# Patient Record
Sex: Female | Born: 1960 | Race: White | Hispanic: No | Marital: Married | Smoking: Never smoker
Health system: Southern US, Community
[De-identification: ages and names within clinical notes are randomized; demographics above are authoritative.]

## PROBLEM LIST (undated history)

## (undated) DIAGNOSIS — H269 Unspecified cataract: Secondary | ICD-10-CM

## (undated) HISTORY — PX: MENISCUS REPAIR: SHX5179

## (undated) HISTORY — PX: KNEE SURGERY: SHX244

## (undated) HISTORY — DX: Unspecified cataract: H26.9

## (undated) HISTORY — PX: EYE SURGERY: SHX253

---

## 1998-03-31 ENCOUNTER — Other Ambulatory Visit: Admission: RE | Admit: 1998-03-31 | Discharge: 1998-03-31 | Payer: Self-pay | Admitting: Obstetrics and Gynecology

## 2000-03-29 ENCOUNTER — Other Ambulatory Visit: Admission: RE | Admit: 2000-03-29 | Discharge: 2000-03-29 | Payer: Self-pay | Admitting: Obstetrics and Gynecology

## 2002-04-26 ENCOUNTER — Other Ambulatory Visit: Admission: RE | Admit: 2002-04-26 | Discharge: 2002-04-26 | Payer: Self-pay | Admitting: Obstetrics and Gynecology

## 2004-01-21 ENCOUNTER — Other Ambulatory Visit: Admission: RE | Admit: 2004-01-21 | Discharge: 2004-01-21 | Payer: Self-pay | Admitting: Obstetrics and Gynecology

## 2005-07-12 ENCOUNTER — Other Ambulatory Visit: Admission: RE | Admit: 2005-07-12 | Discharge: 2005-07-12 | Payer: Self-pay | Admitting: Obstetrics and Gynecology

## 2010-10-23 ENCOUNTER — Encounter: Admission: RE | Admit: 2010-10-23 | Discharge: 2010-10-23 | Payer: Self-pay | Admitting: Orthopedic Surgery

## 2013-11-07 ENCOUNTER — Ambulatory Visit: Payer: BC Managed Care – PPO | Admitting: Family Medicine

## 2013-11-07 VITALS — BP 118/72 | HR 86 | Temp 98.1°F | Resp 16 | Ht 67.0 in | Wt 129.0 lb

## 2013-11-07 DIAGNOSIS — J069 Acute upper respiratory infection, unspecified: Secondary | ICD-10-CM

## 2013-11-07 DIAGNOSIS — R05 Cough: Secondary | ICD-10-CM

## 2013-11-07 DIAGNOSIS — R059 Cough, unspecified: Secondary | ICD-10-CM

## 2013-11-07 DIAGNOSIS — J029 Acute pharyngitis, unspecified: Secondary | ICD-10-CM

## 2013-11-07 MED ORDER — AZITHROMYCIN 250 MG PO TABS: ORAL_TABLET | ORAL | Status: AC

## 2013-11-07 MED ORDER — HYDROCODONE-HOMATROPINE 5-1.5 MG/5ML PO SYRP: 5.0000 mL | ORAL_SOLUTION | Freq: Three times a day (TID) | ORAL | Status: AC | PRN

## 2013-11-07 NOTE — Progress Notes (Signed)
° °  Subjective:    Patient ID: Katie Ayala, female    DOB: 07/30/1965, 52 y.o.   MRN: [id number]  This chart was scribed for Elvina Sidle, MD by Blanchard Kelch, ED Scribe. The patient was seen in room 10. Patient's care was started at 8:26 PM.   HPI  Katie Ayala is a 52 y.o. female who presents to office complaining of a constant upper respiratory infection that began a few days ago. Associated symptoms include subjective fever, sore throat congestion, and cough. The cough is worse at night. She states that she lost her voice completely yesterday but has been able to speak a little today. She took two Tylenol before coming in without relief of symptoms.   Her son is in the [employer]. He has been in [place] for six months. 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  Past Medical History  Diagnosis Date   Cataract    Past Surgical History  Procedure Laterality Date   Cesarean section     Eye surgery     Knee surgery     History reviewed. No pertinent family history. No current outpatient prescriptions on file prior to visit.   No current facility-administered medications on file prior to visit.   No Known Allergies   Review of Systems  Constitutional: Positive for fever.  HENT: Positive for congestion and sore throat. Negative for drooling.   Eyes: Negative for discharge.  Respiratory: Positive for cough.   Cardiovascular: Negative for leg swelling.  Gastrointestinal: Negative for vomiting.  Endocrine: Negative for polyuria.  Genitourinary: Negative for hematuria.  Musculoskeletal: Negative for gait problem.  Skin: Negative for rash.  Allergic/Immunologic: Negative for immunocompromised state.  Neurological: Negative for speech difficulty.  Hematological: Positive for adenopathy.  Psychiatric/Behavioral: Negative for confusion.       Objective:   Physical Exam  Nursing note and vitals  reviewed. CONSTITUTIONAL: Well developed/well nourished HEAD: Normocephalic/atraumatic EYES: EOMI/PERRL ENMT: Mucous membranes moist, oropharynx erythematous, right ear effusion NECK: supple no meningeal signs, anterior cervical adenopathy SPINE:entire spine nontender CV: S1/S2 noted, no murmurs/rubs/gallops noted LUNGS: Lungs are clear to auscultation bilaterally, no apparent distress ABDOMEN: soft, nontender, no rebound or guarding GU:no cva tenderness NEURO: Pt is awake/alert, moves all extremitiesx4 EXTREMITIES: pulses normal, full ROM SKIN: warm, color normal PSYCH: no abnormalities of mood noted       Assessment & Plan:   I personally performed the services described in this documentation, which was scribed in my presence. The recorded information has been reviewed and is accurate.  Cough - Plan: azithromycin (ZITHROMAX Z-PAK) 250 MG tablet, HYDROcodone-homatropine (HYCODAN) 5-1.5 MG/5ML syrup  Acute pharyngitis - Plan: azithromycin (ZITHROMAX Z-PAK) 250 MG tablet, HYDROcodone-homatropine (HYCODAN) 5-1.5 MG/5ML syrup  Acute upper respiratory infections of unspecified site - Plan: azithromycin (ZITHROMAX Z-PAK) 250 MG tablet, HYDROcodone-homatropine (HYCODAN) 5-1.5 MG/5ML syrup  Signed, Elvina Sidle, MD

## 2014-07-23 ENCOUNTER — Ambulatory Visit (INDEPENDENT_AMBULATORY_CARE_PROVIDER_SITE_OTHER): Payer: BC Managed Care – PPO | Admitting: Internal Medicine

## 2014-07-23 VITALS — BP 114/70 | HR 65 | Temp 98.0°F | Resp 16 | Ht 66.5 in | Wt 128.6 lb

## 2014-07-23 DIAGNOSIS — H6122 Impacted cerumen, left ear: Secondary | ICD-10-CM

## 2014-07-23 DIAGNOSIS — H6120 Impacted cerumen, unspecified ear: Secondary | ICD-10-CM

## 2014-07-23 NOTE — Progress Notes (Signed)
   Subjective:  This chart was scribed for Tonye Pearsonobert P Charniece Venturino, MD by Charline BillsEssence Howell, ED Scribe. The patient was seen in room 9. Patient's care was started at 10:58 AM.   Patient ID: Katie BeckersElizabeth Ayala, female    DOB: 1961-10-19, 53 y.o.   MRN: [id number]  Chief Complaint  Patient presents with  . Otalgia    left x 4 days   HPI HPI Comments: Katie Beckerslizabeth Agro is a 53 y.o. female who presents to the [facility] complaining of L ear pain onset 4 days ago. Pt reports associated dullness in hearing. She states "it feels like I have 10 cotton balls in my ear." She reports one similar episode. Pt has taken Zyrtec with no relief. Pt has also tried applying warm water and peroxide in her ear with no relief.   Past Medical History  Diagnosis Date  . Cataract    No current outpatient prescriptions on file prior to visit.   No current facility-administered medications on file prior to visit.   Not on File  Review of Systems  Constitutional: Negative for fever, chills and fatigue.  HENT: Positive for ear pain.       Objective:   Physical Exam  Nursing note and vitals reviewed. Constitutional: She is oriented to person, place, and time. She appears well-developed and well-nourished.  HENT:  Head: Normocephalic and atraumatic.  Right Ear: Tympanic membrane normal.  L canal: Totally occluded by wax  Eyes: Conjunctivae and EOM are normal.  Neck: Neck supple.  Cardiovascular: Normal rate.   Pulmonary/Chest: Effort normal.  Musculoskeletal: Normal range of motion.  Neurological: She is alert and oriented to person, place, and time.  Skin: Skin is warm and dry.  Psychiatric: She has a normal mood and affect. Her behavior is normal.  Triage Vitals: BP 114/70  Pulse 65  Temp(Src) 98 F (36.7 C) (Oral)  Resp 16  Ht 5' 6.5" (1.689 m)  Wt 128 lb 9.6 oz (58.333 kg)  BMI 20.45 kg/m2  SpO2 100%     Irrigation successful-canal clear without inflammation/TM  intact  Assessment & Plan:   I personally performed the services described in this documentation, which was scribed in my presence. The recorded information has been reviewed and is accurate.  Cerumen impaction resolved by irrigation  Discussed preventive measures Followup when necessary

## 2014-08-16 ENCOUNTER — Telehealth: Payer: Self-pay

## 2014-08-16 NOTE — Telephone Encounter (Signed)
Pt has a high deductible. Advised her that we have the vaccine here that we are happy to administer, but she can get it at [facility] for half of the price. Can we prescribe this for her?

## 2014-08-16 NOTE — Telephone Encounter (Signed)
Patient needs RX for whooping cough vaccination before she can be around her grandchild.

## 2014-08-17 MED ORDER — TETANUS-DIPHTH-ACELL PERTUSSIS 5-2.5-18.5 LF-MCG/0.5 IM SUSP: 0.5000 mL | Freq: Once | INTRAMUSCULAR | Status: AC

## 2014-08-17 NOTE — Telephone Encounter (Signed)
Sent to the pharmacy.  Please have her bring [place] the documentation so we can put it in her chart.

## 2014-08-18 NOTE — Telephone Encounter (Signed)
lmom that rx was sent into pharmacy and for her to bring in documentation

## 2016-03-22 ENCOUNTER — Telehealth: Payer: Self-pay | Admitting: Hematology

## 2016-03-22 NOTE — Telephone Encounter (Signed)
Pt called in inquring about the cost and how to obtain a new patient appt and asked question about her insurance.

## 2016-06-02 ENCOUNTER — Other Ambulatory Visit: Payer: Self-pay | Admitting: Obstetrics and Gynecology

## 2016-06-02 DIAGNOSIS — R928 Other abnormal and inconclusive findings on diagnostic imaging of breast: Secondary | ICD-10-CM

## 2016-06-09 ENCOUNTER — Ambulatory Visit
Admission: RE | Admit: 2016-06-09 | Discharge: 2016-06-09 | Disposition: A | Discharge status: Home / Self Care | Payer: BLUE CROSS/BLUE SHIELD | Source: Ambulatory Visit | Attending: Obstetrics and Gynecology | Admitting: Obstetrics and Gynecology

## 2016-06-09 DIAGNOSIS — R928 Other abnormal and inconclusive findings on diagnostic imaging of breast: Secondary | ICD-10-CM

## 2020-08-12 ENCOUNTER — Other Ambulatory Visit: Payer: Self-pay | Admitting: Obstetrics and Gynecology

## 2020-08-12 DIAGNOSIS — R928 Other abnormal and inconclusive findings on diagnostic imaging of breast: Secondary | ICD-10-CM

## 2020-08-26 ENCOUNTER — Other Ambulatory Visit: Payer: Self-pay | Admitting: Obstetrics and Gynecology

## 2020-08-26 ENCOUNTER — Other Ambulatory Visit: Payer: Self-pay

## 2020-08-26 ENCOUNTER — Ambulatory Visit
Admission: RE | Admit: 2020-08-26 | Discharge: 2020-08-26 | Disposition: A | Discharge status: Home / Self Care | Payer: No Typology Code available for payment source | Source: Ambulatory Visit | Attending: Obstetrics and Gynecology | Admitting: Obstetrics and Gynecology

## 2020-08-26 DIAGNOSIS — R928 Other abnormal and inconclusive findings on diagnostic imaging of breast: Secondary | ICD-10-CM

## 2020-08-26 DIAGNOSIS — R921 Mammographic calcification found on diagnostic imaging of breast: Secondary | ICD-10-CM

## 2020-09-16 ENCOUNTER — Other Ambulatory Visit: Payer: Self-pay

## 2020-09-16 ENCOUNTER — Ambulatory Visit
Admission: RE | Admit: 2020-09-16 | Discharge: 2020-09-16 | Disposition: A | Discharge status: Home / Self Care | Payer: No Typology Code available for payment source | Source: Ambulatory Visit | Attending: Obstetrics and Gynecology | Admitting: Obstetrics and Gynecology

## 2020-09-16 DIAGNOSIS — R921 Mammographic calcification found on diagnostic imaging of breast: Secondary | ICD-10-CM

## 2020-09-26 ENCOUNTER — Other Ambulatory Visit: Payer: Self-pay

## 2020-09-26 ENCOUNTER — Ambulatory Visit
Admission: RE | Admit: 2020-09-26 | Discharge: 2020-09-26 | Disposition: A | Discharge status: Home / Self Care | Payer: No Typology Code available for payment source | Source: Ambulatory Visit | Attending: Obstetrics and Gynecology | Admitting: Obstetrics and Gynecology

## 2020-09-26 ENCOUNTER — Ambulatory Visit
Admission: RE | Admit: 2020-09-26 | Discharge: 2020-09-26 | Disposition: A | Discharge status: Home / Self Care | Payer: PRIVATE HEALTH INSURANCE | Source: Ambulatory Visit | Attending: Obstetrics and Gynecology | Admitting: Obstetrics and Gynecology

## 2020-09-26 HISTORY — PX: BREAST BIOPSY: SHX20

## 2020-10-01 ENCOUNTER — Other Ambulatory Visit: Payer: Self-pay | Admitting: Obstetrics and Gynecology

## 2020-10-01 DIAGNOSIS — R921 Mammographic calcification found on diagnostic imaging of breast: Secondary | ICD-10-CM

## 2020-11-28 ENCOUNTER — Ambulatory Visit: Payer: Self-pay

## 2021-02-27 ENCOUNTER — Other Ambulatory Visit: Payer: Self-pay | Admitting: Obstetrics and Gynecology

## 2021-02-27 DIAGNOSIS — R921 Mammographic calcification found on diagnostic imaging of breast: Secondary | ICD-10-CM

## 2021-04-15 ENCOUNTER — Other Ambulatory Visit: Payer: Self-pay | Admitting: Obstetrics and Gynecology

## 2021-04-15 ENCOUNTER — Ambulatory Visit
Admission: RE | Admit: 2021-04-15 | Discharge: 2021-04-15 | Disposition: A | Discharge status: Home / Self Care | Payer: Self-pay | Source: Ambulatory Visit | Attending: Obstetrics and Gynecology | Admitting: Obstetrics and Gynecology

## 2021-04-15 ENCOUNTER — Ambulatory Visit
Admission: RE | Admit: 2021-04-15 | Discharge: 2021-04-15 | Disposition: A | Discharge status: Home / Self Care | Payer: PRIVATE HEALTH INSURANCE | Source: Ambulatory Visit | Attending: Obstetrics and Gynecology | Admitting: Obstetrics and Gynecology

## 2021-04-15 DIAGNOSIS — N6320 Unspecified lump in the left breast, unspecified quadrant: Secondary | ICD-10-CM

## 2021-04-15 DIAGNOSIS — R921 Mammographic calcification found on diagnostic imaging of breast: Secondary | ICD-10-CM

## 2021-04-29 ENCOUNTER — Ambulatory Visit
Admission: RE | Admit: 2021-04-29 | Discharge: 2021-04-29 | Disposition: A | Discharge status: Home / Self Care | Payer: PRIVATE HEALTH INSURANCE | Source: Ambulatory Visit | Attending: Obstetrics and Gynecology | Admitting: Obstetrics and Gynecology

## 2021-04-29 ENCOUNTER — Other Ambulatory Visit: Payer: Self-pay

## 2021-04-29 DIAGNOSIS — R921 Mammographic calcification found on diagnostic imaging of breast: Secondary | ICD-10-CM

## 2021-08-04 IMAGING — MG MM BREAST LOCALIZATION CLIP
4 series · 4 of 12 positions shown · non-contrast
Comparison: Previous exams including recent diagnostic mammogram
dated 04/15/2021.

CLINICAL DATA: Status post stereotactic biopsy for indeterminate
calcifications within the upper-outer quadrant of the LEFT breast.

Recent biopsy-proven complex sclerosing lesion within the
upper-outer quadrant of the LEFT breast (coil shaped clip).
EXAM:
DIAGNOSTIC LEFT MAMMOGRAM POST STEREOTACTIC BIOPSY

[L CC synth-2D]
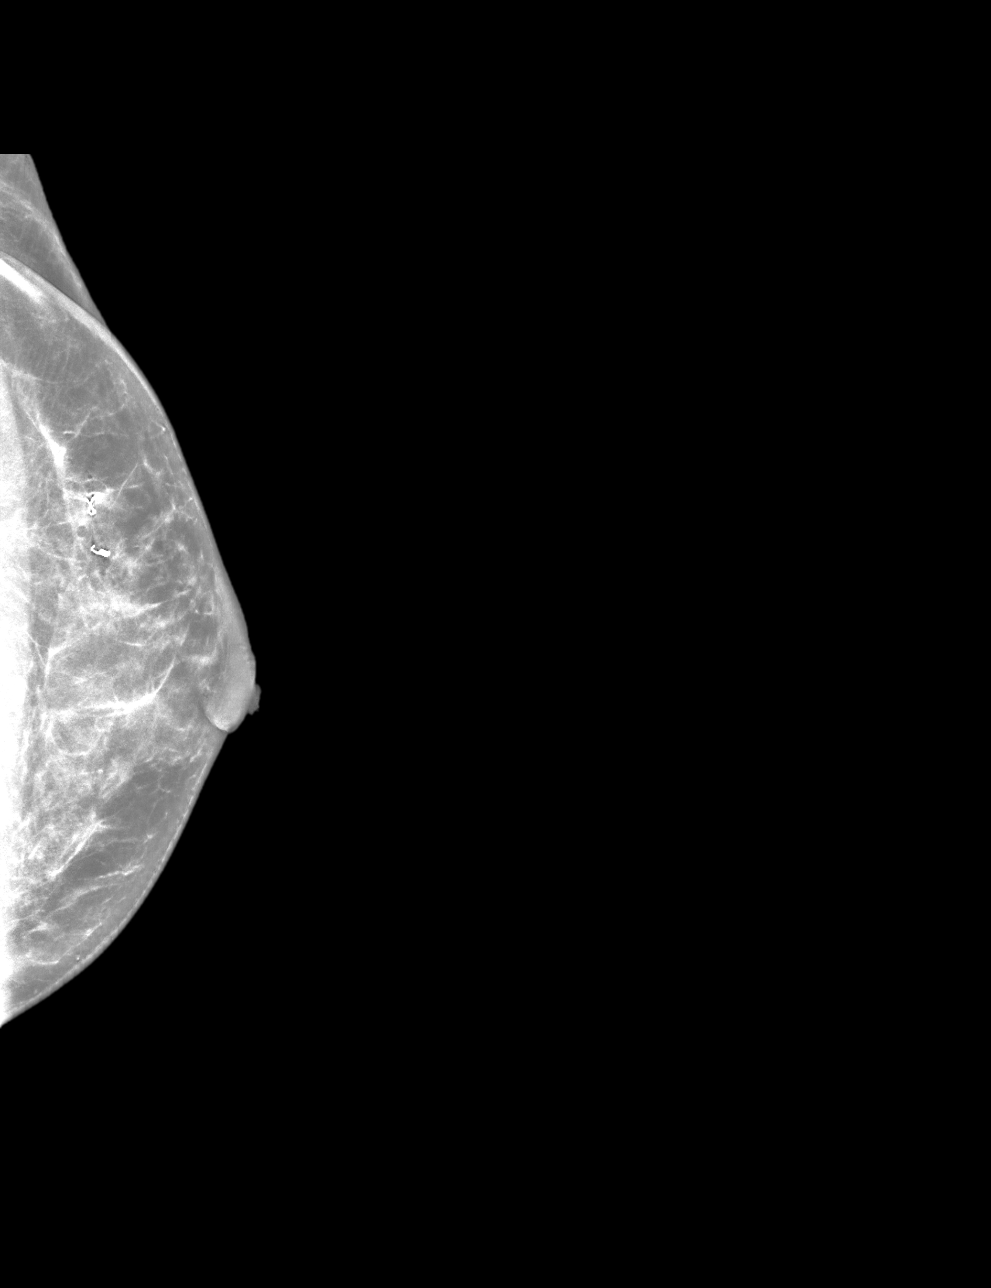

[L LM synth-2D]
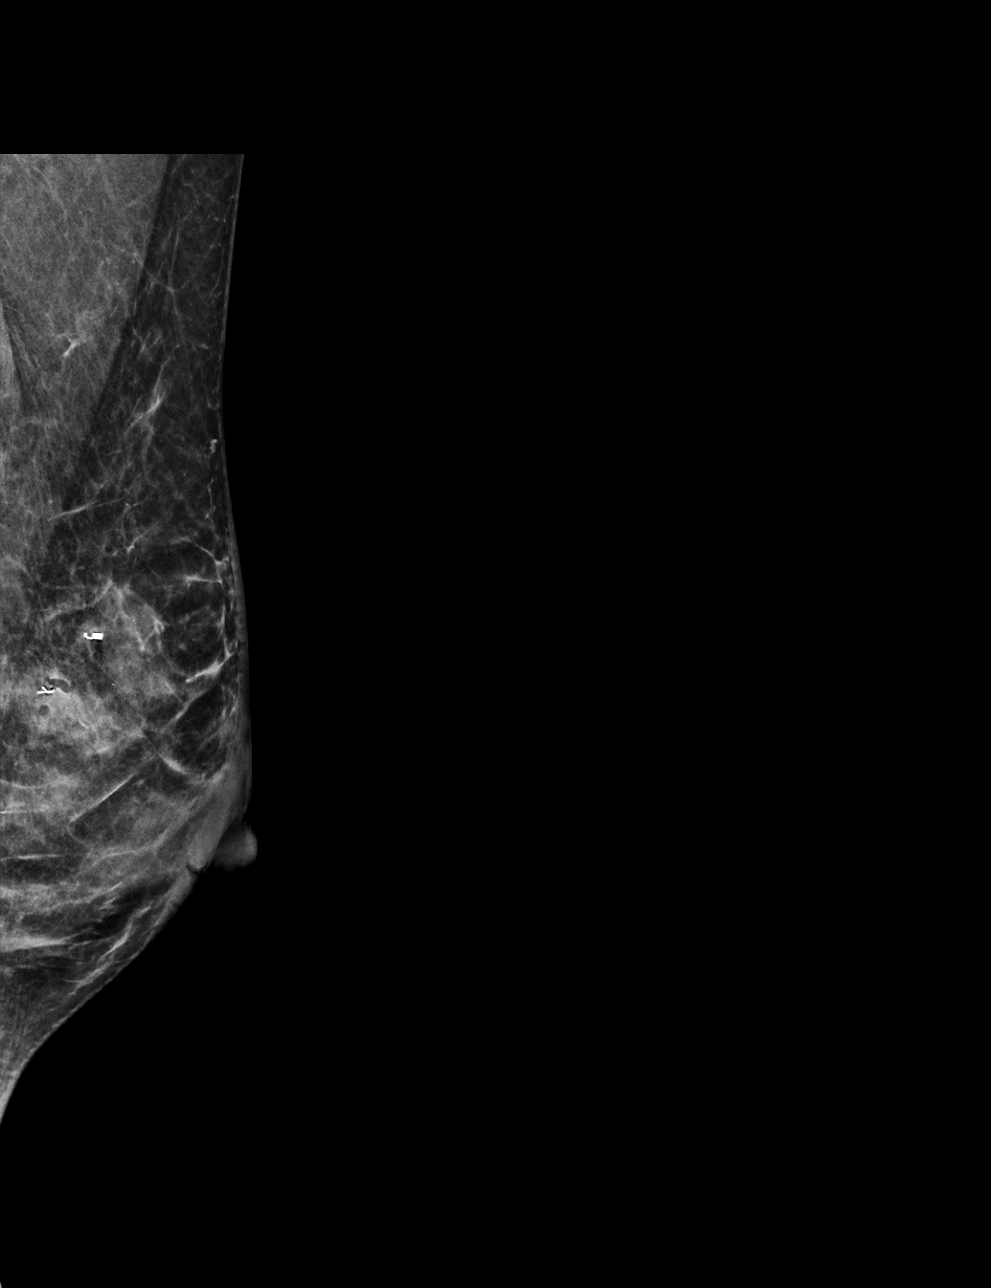

[L LM tomo · tomo slice 22/43.0]
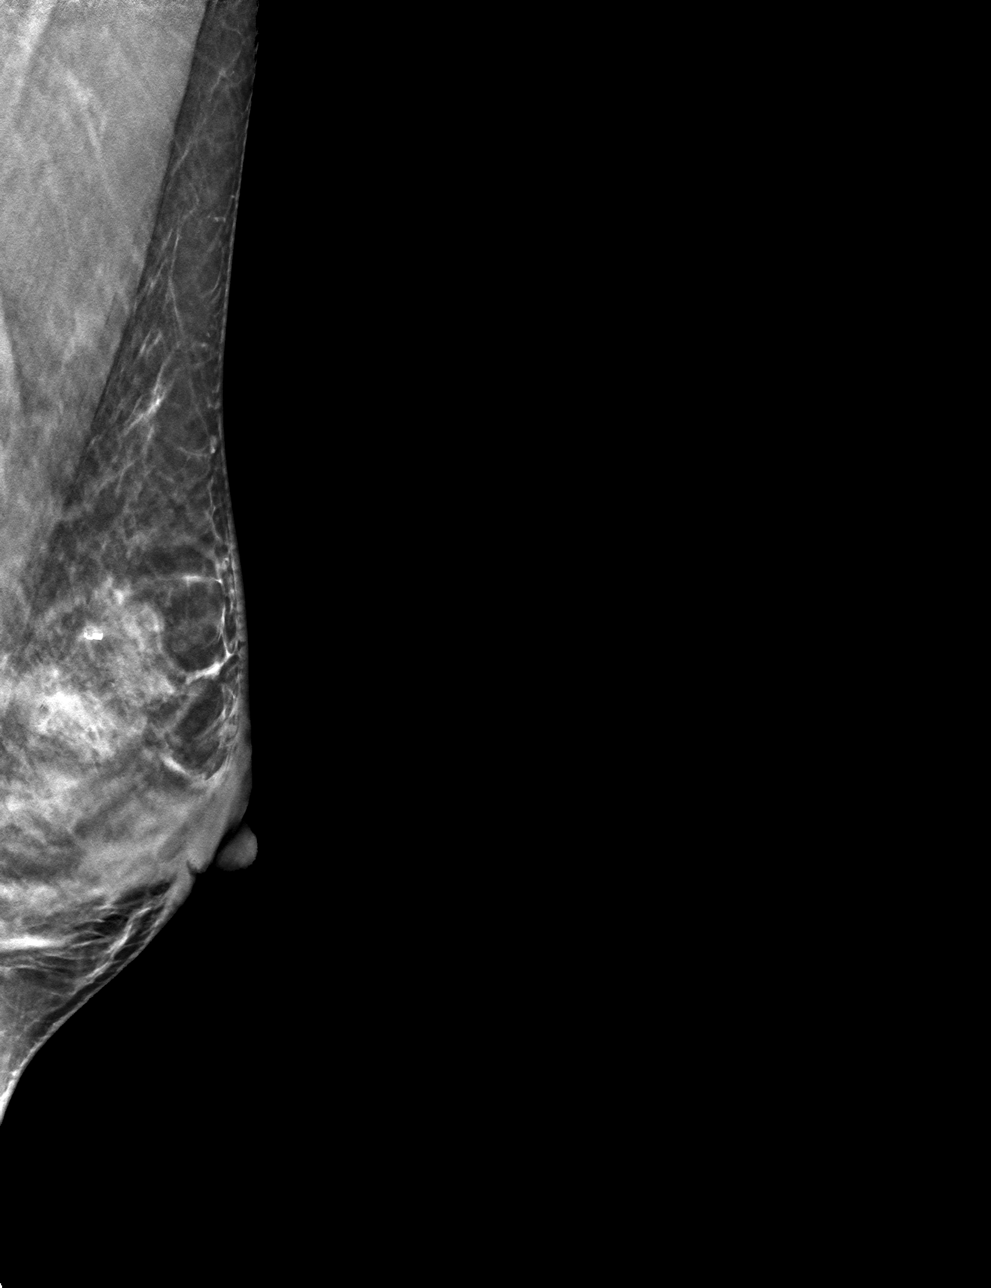

[L CC tomo · tomo slice 25/49.0]
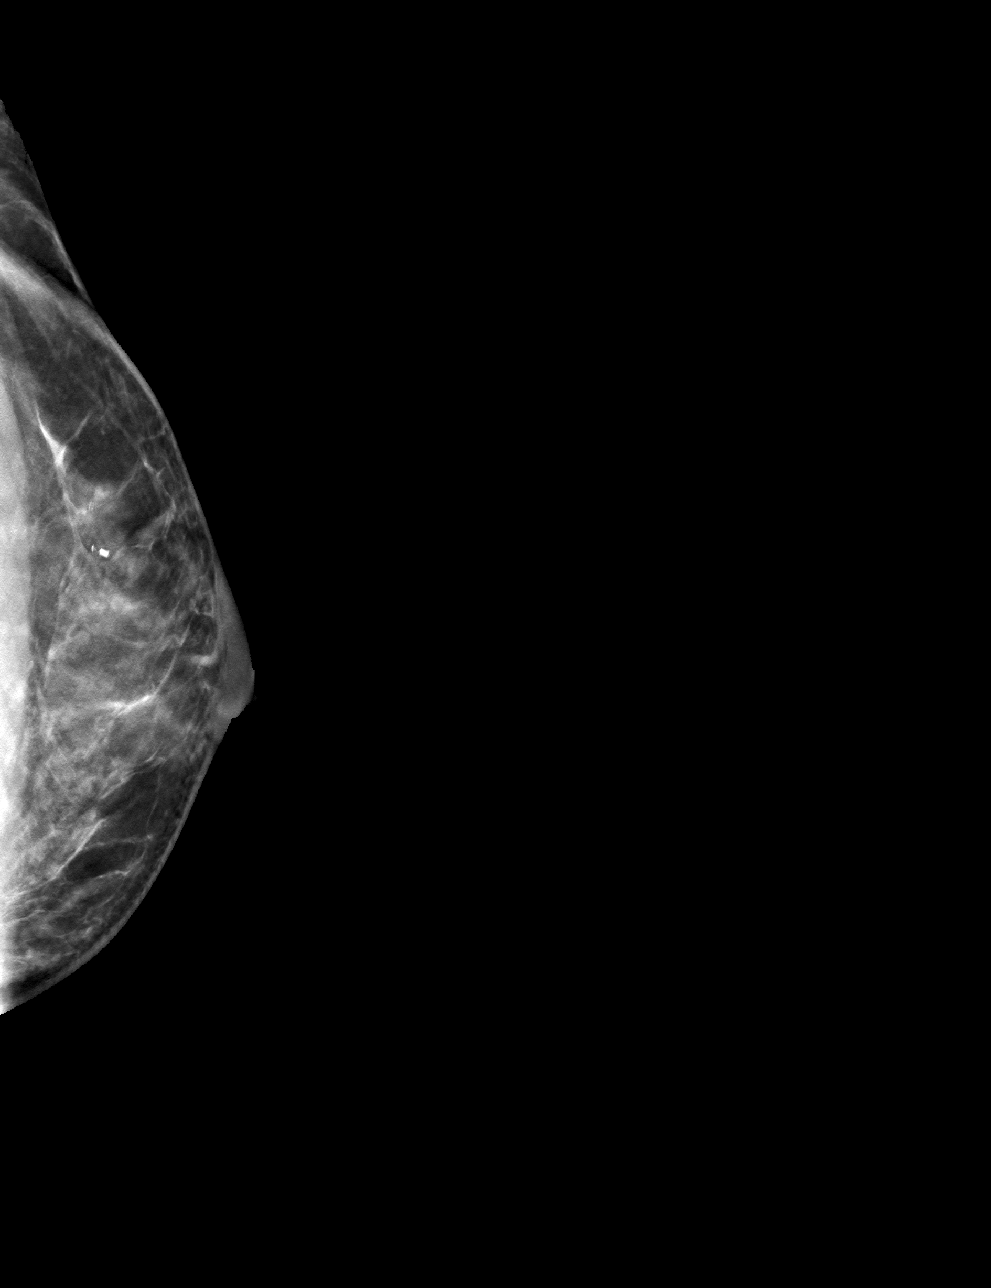

[4 of 12 positions shown; findings below may reference images not displayed]

FINDINGS: Mammographic images were obtained following stereotactic guided
biopsy of the indeterminate calcifications within the upper-outer
quadrant of the LEFT breast. The biopsy marking clip is in expected
position at the site of biopsy.
IMPRESSION: 1. Appropriate positioning of the ribbon shaped biopsy marking clip
at the site of biopsy in the upper-outer quadrant of the LEFT
breast.
2. Coil shaped clip within the upper-outer quadrant of the LEFT
breast corresponds to a biopsy-proven complex sclerosing lesion for
which surgical excision is recommended.
3. Additional similar-appearing amorphous calcifications described
on recent diagnostic mammogram report of 04/15/2021 are located
approximately 1 cm anterior-medial to the ribbon clip and less than
1 cm inferior-medial to the coil shaped clip, therefore, no
additional stereotactic biopsy is required today. If surgical
lumpectomy/excision is eventually required for the calcifications
biopsied today based on pathology result, the additional
similar-appearing calcifications described above can be localized
for excision as well.

Final Assessment: Post Procedure Mammograms for Marker Placement

## 2021-08-04 IMAGING — MG MM BREAST BX W LOC DEV 1ST LESION IMAGE BX SPEC STEREO GUIDE*L*
7 of 16 series · 7 of 36 positions shown · non-contrast
Comparison: Previous exams.
COMPARISON: Previous exams.

Addendum:
CLINICAL DATA: Biopsy-proven complex sclerosing lesion within the
upper-outer quadrant of the LEFT breast, with associated coil shaped
biopsy clip.

[L (1 of 7)]
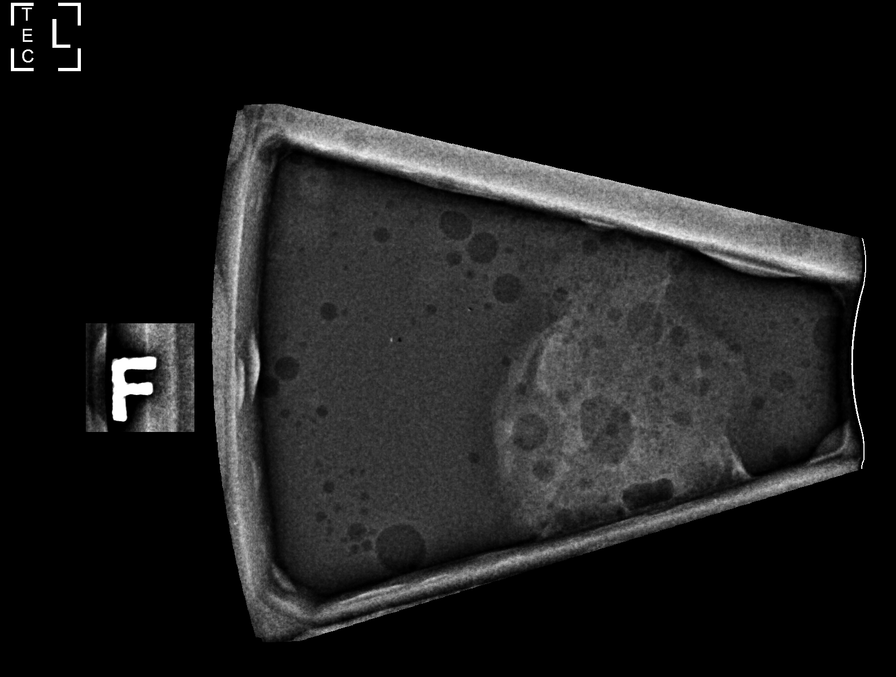

[L (2 of 7)]
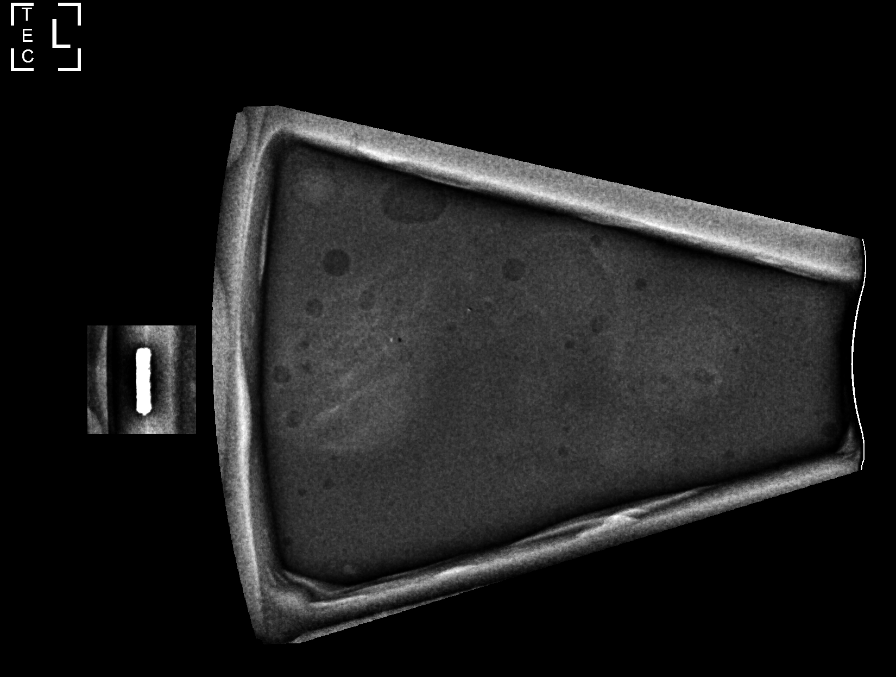

[L (3 of 7)]
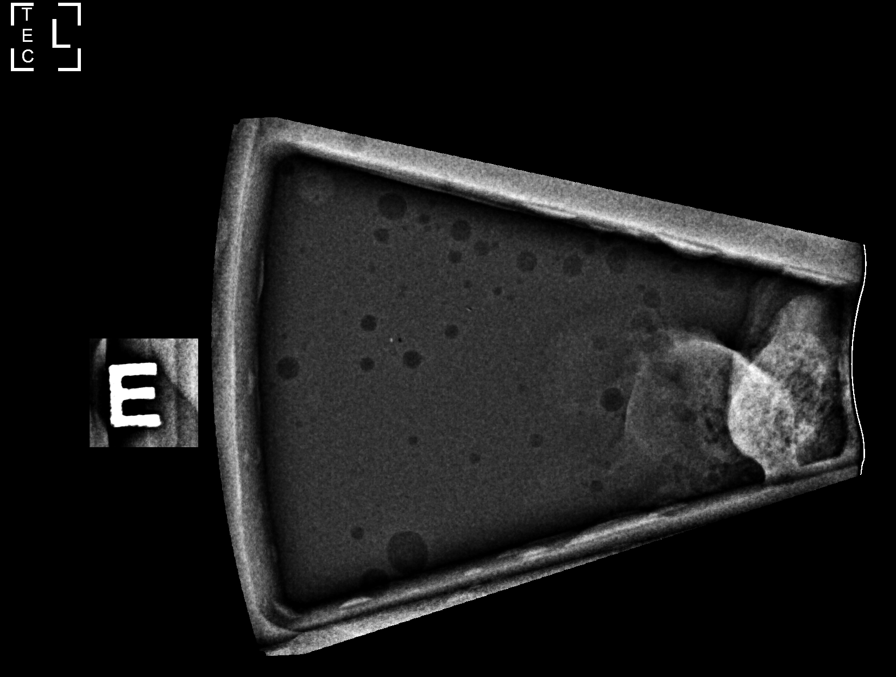

[L (4 of 7)]
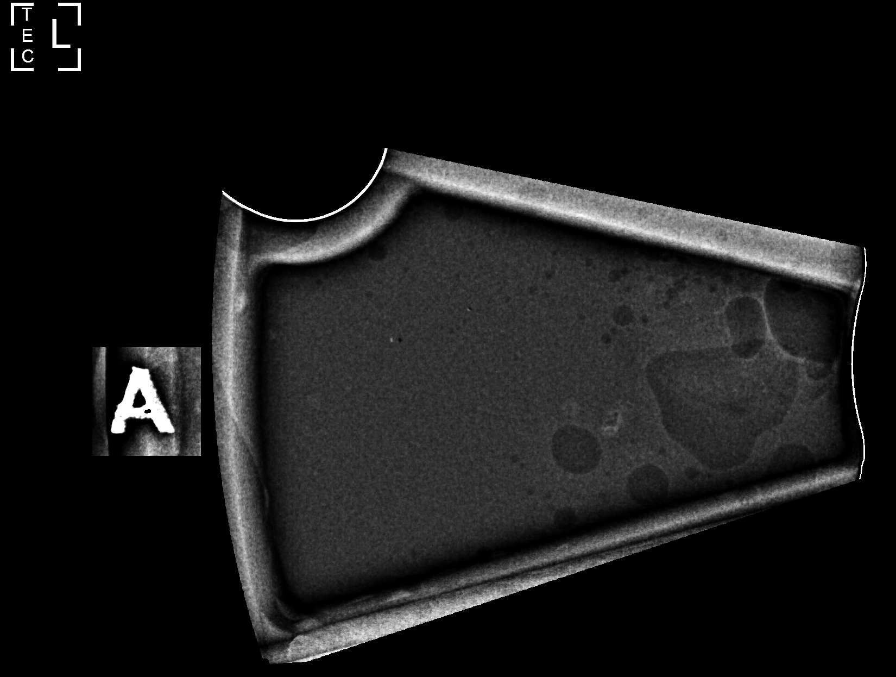

[L (5 of 7)]
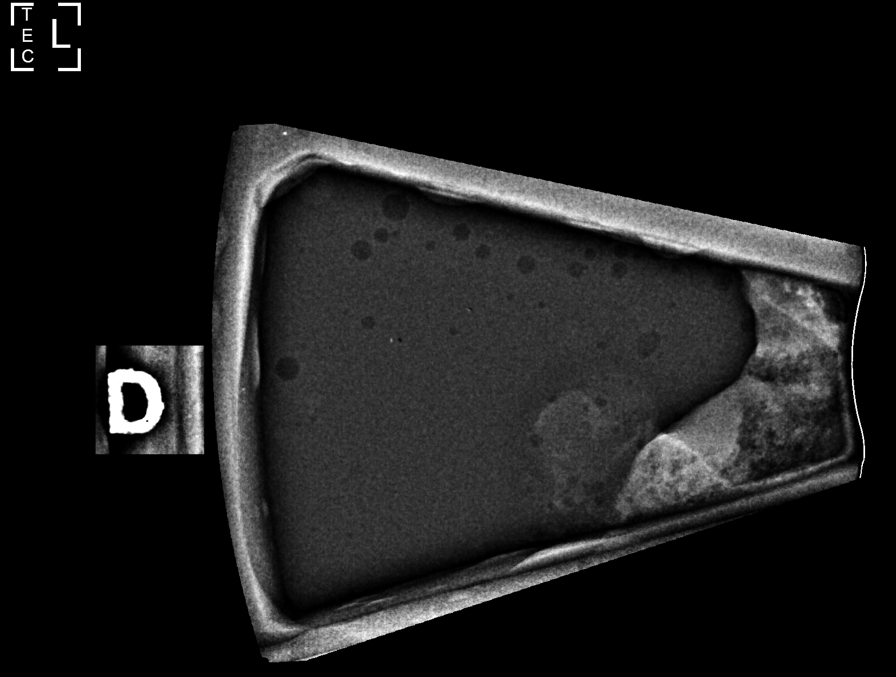

[L (6 of 7)]
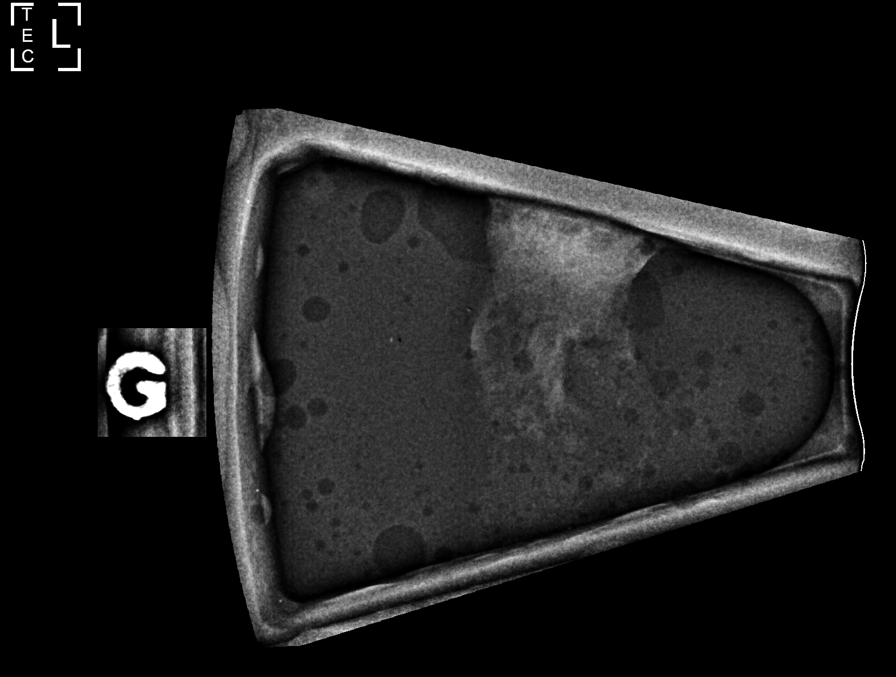

[L (7 of 7)]
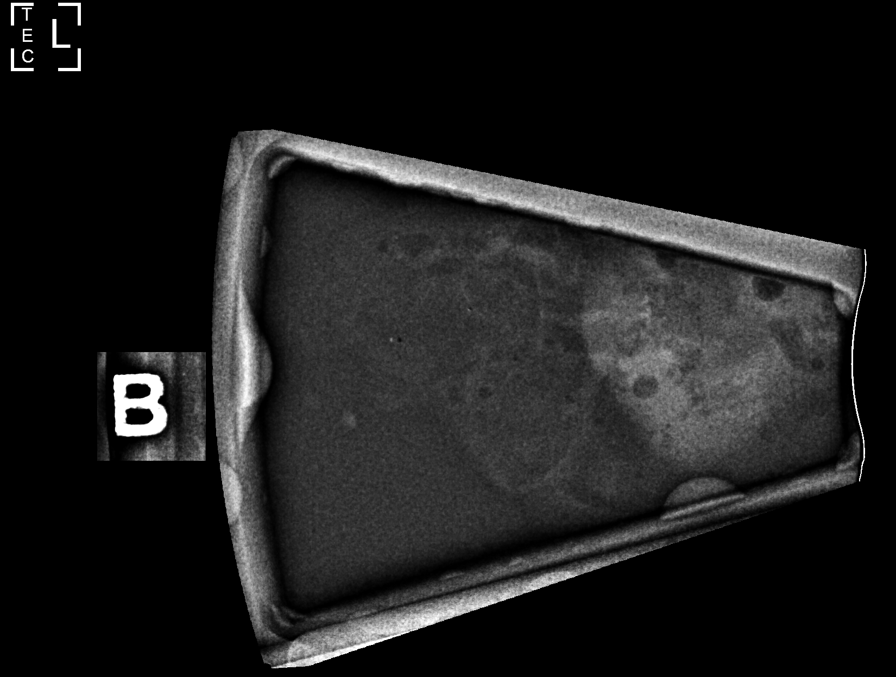

[7 of 36 positions shown; findings below may reference images not displayed]

Patient presents today for additional stereotactic biopsy of
indeterminate amorphous calcifications loosely grouped within the
upper-outer quadrant of the LEFT breast. Recent diagnostic exam
04/15/2021 demonstrated the 2.6 cm extent of calcifications with
recommendation to attempt stereotactic biopsy of both the posterior
and anterior extent of the calcifications. As described below, in
the IMPRESSION section below, only 1 stereotactic biopsy was needed
today based the location/extent of the calcifications and the biopsy
site markers.

EXAM:
LEFT BREAST STEREOTACTIC CORE NEEDLE BIOPSY
FINDINGS: The patient and I discussed the procedure of stereotactic-guided
biopsy including benefits and alternatives. We discussed the high
likelihood of a successful procedure. We discussed the risks of the
procedure including infection, bleeding, tissue injury, clip
migration, and inadequate sampling. Informed written consent was
given. The usual time out protocol was performed immediately prior
to the procedure.

Using sterile technique and 1% Lidocaine as local anesthetic, under
stereotactic guidance, a 9 gauge vacuum assisted device was used to
perform core needle biopsy of calcifications in the upper outer
quadrant of the left breast using a lateral approach. Specimen
radiograph was performed showing calcifications. Specimens with
calcifications are identified for pathology.

Lesion quadrant: Upper outer quadrant

At the conclusion of the procedure, ribbon shaped tissue marker clip
was deployed into the biopsy cavity. Follow-up 2-view mammogram was
performed and dictated separately.
IMPRESSION: Stereotactic-guided biopsy of indeterminate calcifications within
the upper-outer quadrant of the LEFT breast. No apparent
complications.

On the post clip mammogram, the additional similar-appearing
calcifications described on recent diagnostic mammogram report of
04/15/2021 are located approximately 1 cm anterior-medial to the
ribbon clip and less than 1 cm inferior-medial to the coil shaped
clip, therefore, no additional stereotactic biopsy was required
today.

ADDENDUM:
Pathology revealed FIBROCYSTIC CHANGE WITH CALCIFICATIONS of the
LEFT breast, upper outer quadrant, (ribbon clip). This was found to
be concordant by Dr. Ferienhaus Erxleben.

Pathology results were discussed with the patient by telephone. The
patient reported doing well after the biopsy with tenderness and
bleeding at the site. Post biopsy instructions and care were
reviewed and questions were answered. The patient was encouraged to
call The [REDACTED] for any additional
concerns. My direct phone number was provided.

The patient has biopsy-proven ([DATE]) complex sclerosing lesion
within the upper-outer quadrant of the LEFT breast, with associated
coil clip, excision recommended.

The patient was instructed she could contact her provider or myself
for a surgical referral after she confirms network coverage with her
insurance company, per patient request.

Pathology results reported by Abi Leger, RN on 04/30/2021.

*** End of Addendum ***
Patient presents today for additional stereotactic biopsy of
indeterminate amorphous calcifications loosely grouped within the
upper-outer quadrant of the LEFT breast. Recent diagnostic exam
04/15/2021 demonstrated the 2.6 cm extent of calcifications with
recommendation to attempt stereotactic biopsy of both the posterior
and anterior extent of the calcifications. As described below, in
the IMPRESSION section below, only 1 stereotactic biopsy was needed
today based the location/extent of the calcifications and the biopsy
site markers.

EXAM:
LEFT BREAST STEREOTACTIC CORE NEEDLE BIOPSY
FINDINGS: The patient and I discussed the procedure of stereotactic-guided
biopsy including benefits and alternatives. We discussed the high
likelihood of a successful procedure. We discussed the risks of the
procedure including infection, bleeding, tissue injury, clip
migration, and inadequate sampling. Informed written consent was
given. The usual time out protocol was performed immediately prior
to the procedure.

Using sterile technique and 1% Lidocaine as local anesthetic, under
stereotactic guidance, a 9 gauge vacuum assisted device was used to
perform core needle biopsy of calcifications in the upper outer
quadrant of the left breast using a lateral approach. Specimen
radiograph was performed showing calcifications. Specimens with
calcifications are identified for pathology.

Lesion quadrant: Upper outer quadrant

At the conclusion of the procedure, ribbon shaped tissue marker clip
was deployed into the biopsy cavity. Follow-up 2-view mammogram was
performed and dictated separately.
IMPRESSION: Stereotactic-guided biopsy of indeterminate calcifications within
the upper-outer quadrant of the LEFT breast. No apparent
complications.

On the post clip mammogram, the additional similar-appearing
calcifications described on recent diagnostic mammogram report of
04/15/2021 are located approximately 1 cm anterior-medial to the
ribbon clip and less than 1 cm inferior-medial to the coil shaped
clip, therefore, no additional stereotactic biopsy was required
today.

## 2022-05-14 ENCOUNTER — Other Ambulatory Visit: Payer: Self-pay | Admitting: Nurse Practitioner

## 2022-05-14 DIAGNOSIS — R921 Mammographic calcification found on diagnostic imaging of breast: Secondary | ICD-10-CM

## 2022-05-14 DIAGNOSIS — R928 Other abnormal and inconclusive findings on diagnostic imaging of breast: Secondary | ICD-10-CM

## 2022-05-21 ENCOUNTER — Other Ambulatory Visit: Payer: PRIVATE HEALTH INSURANCE

## 2022-10-11 ENCOUNTER — Encounter: Payer: Self-pay | Admitting: Psychiatry

## 2022-10-11 DIAGNOSIS — N6321 Unspecified lump in the left breast, upper outer quadrant: Secondary | ICD-10-CM

## 2022-10-12 ENCOUNTER — Other Ambulatory Visit: Payer: Self-pay | Admitting: Nurse Practitioner

## 2022-10-12 DIAGNOSIS — N6321 Unspecified lump in the left breast, upper outer quadrant: Secondary | ICD-10-CM

## 2024-07-13 ENCOUNTER — Other Ambulatory Visit: Payer: Self-pay | Admitting: Obstetrics and Gynecology

## 2024-07-13 DIAGNOSIS — R921 Mammographic calcification found on diagnostic imaging of breast: Secondary | ICD-10-CM

## 2024-07-24 ENCOUNTER — Ambulatory Visit
Admission: RE | Admit: 2024-07-24 | Discharge: 2024-07-24 | Disposition: A | Discharge status: Home / Self Care | Payer: PRIVATE HEALTH INSURANCE | Source: Ambulatory Visit | Attending: Obstetrics and Gynecology | Admitting: Obstetrics and Gynecology

## 2024-07-24 ENCOUNTER — Ambulatory Visit: Payer: PRIVATE HEALTH INSURANCE

## 2024-07-24 DIAGNOSIS — R921 Mammographic calcification found on diagnostic imaging of breast: Secondary | ICD-10-CM
